# Patient Record
Sex: Male | Born: 2000 | Race: Black or African American | Hispanic: No | Marital: Single | Smoking: Never smoker
Health system: Southern US, Community
[De-identification: ages and names within clinical notes are randomized; demographics above are authoritative.]

## PROBLEM LIST (undated history)

## (undated) ENCOUNTER — Ambulatory Visit (HOSPITAL_COMMUNITY): Admission: EM | Payer: Medicaid Other | Source: Home / Self Care

---
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
## 2021-11-26 IMAGING — DX DG HAND 2V*R*
2 series · 2 of 2 positions shown · non-contrast
Comparison: None.

CLINICAL DATA: Right hand and wrist pain since a fall yesterday.
Initial encounter.

EXAM:
RIGHT HAND - 2 VIEW

[hand pa]
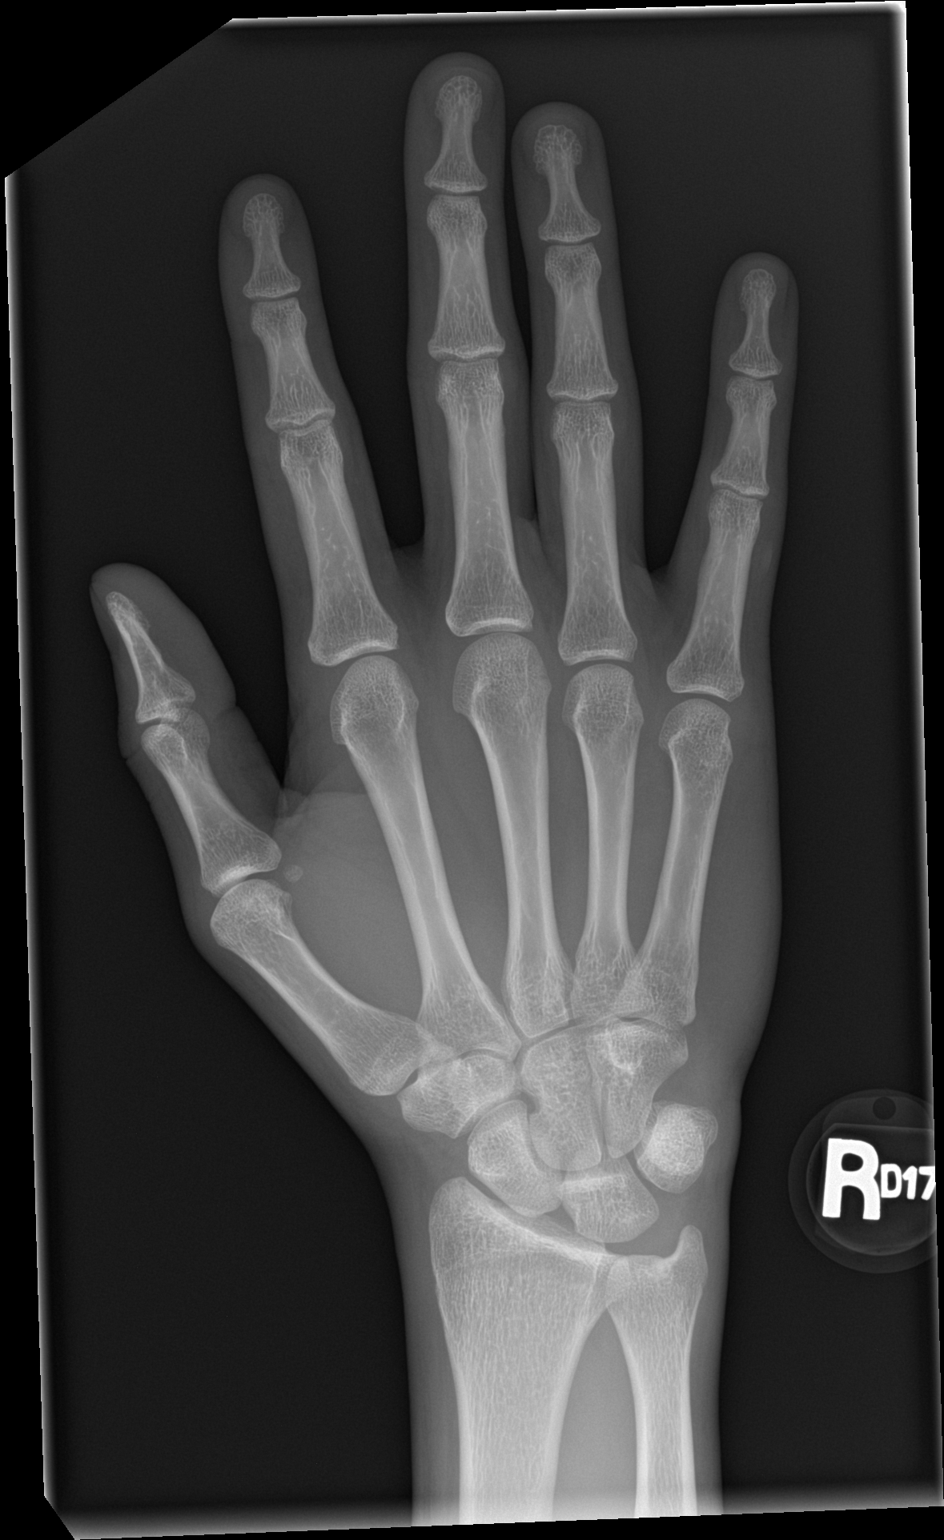

[hand lat]
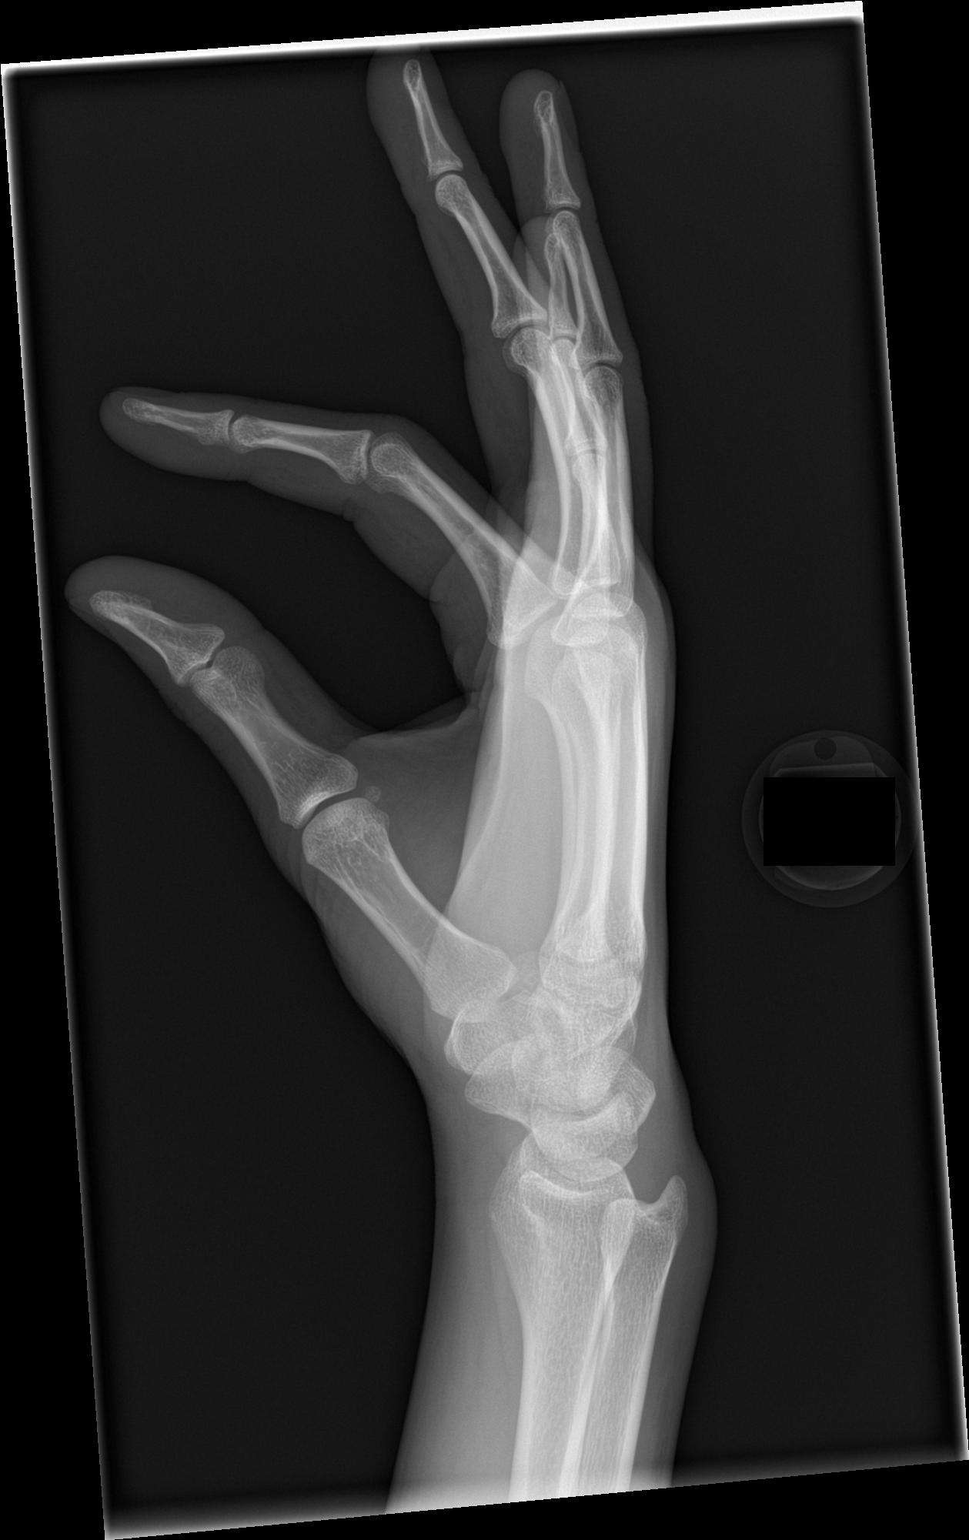

[2 of 2 positions shown; findings below may reference images not displayed]

FINDINGS: There is no evidence of fracture or dislocation. There is no
evidence of arthropathy or other focal bone abnormality. Soft
tissues are unremarkable.
IMPRESSION: Negative exam.

## 2021-11-26 IMAGING — DX DG HUMERUS 2V *R*
2 series · 2 of 2 positions shown · non-contrast
Comparison: None.

CLINICAL DATA: Right upper arm pain since a fall yesterday. Initial
encounter.

EXAM:
RIGHT HUMERUS - 2+ VIEW

[humerus ap]
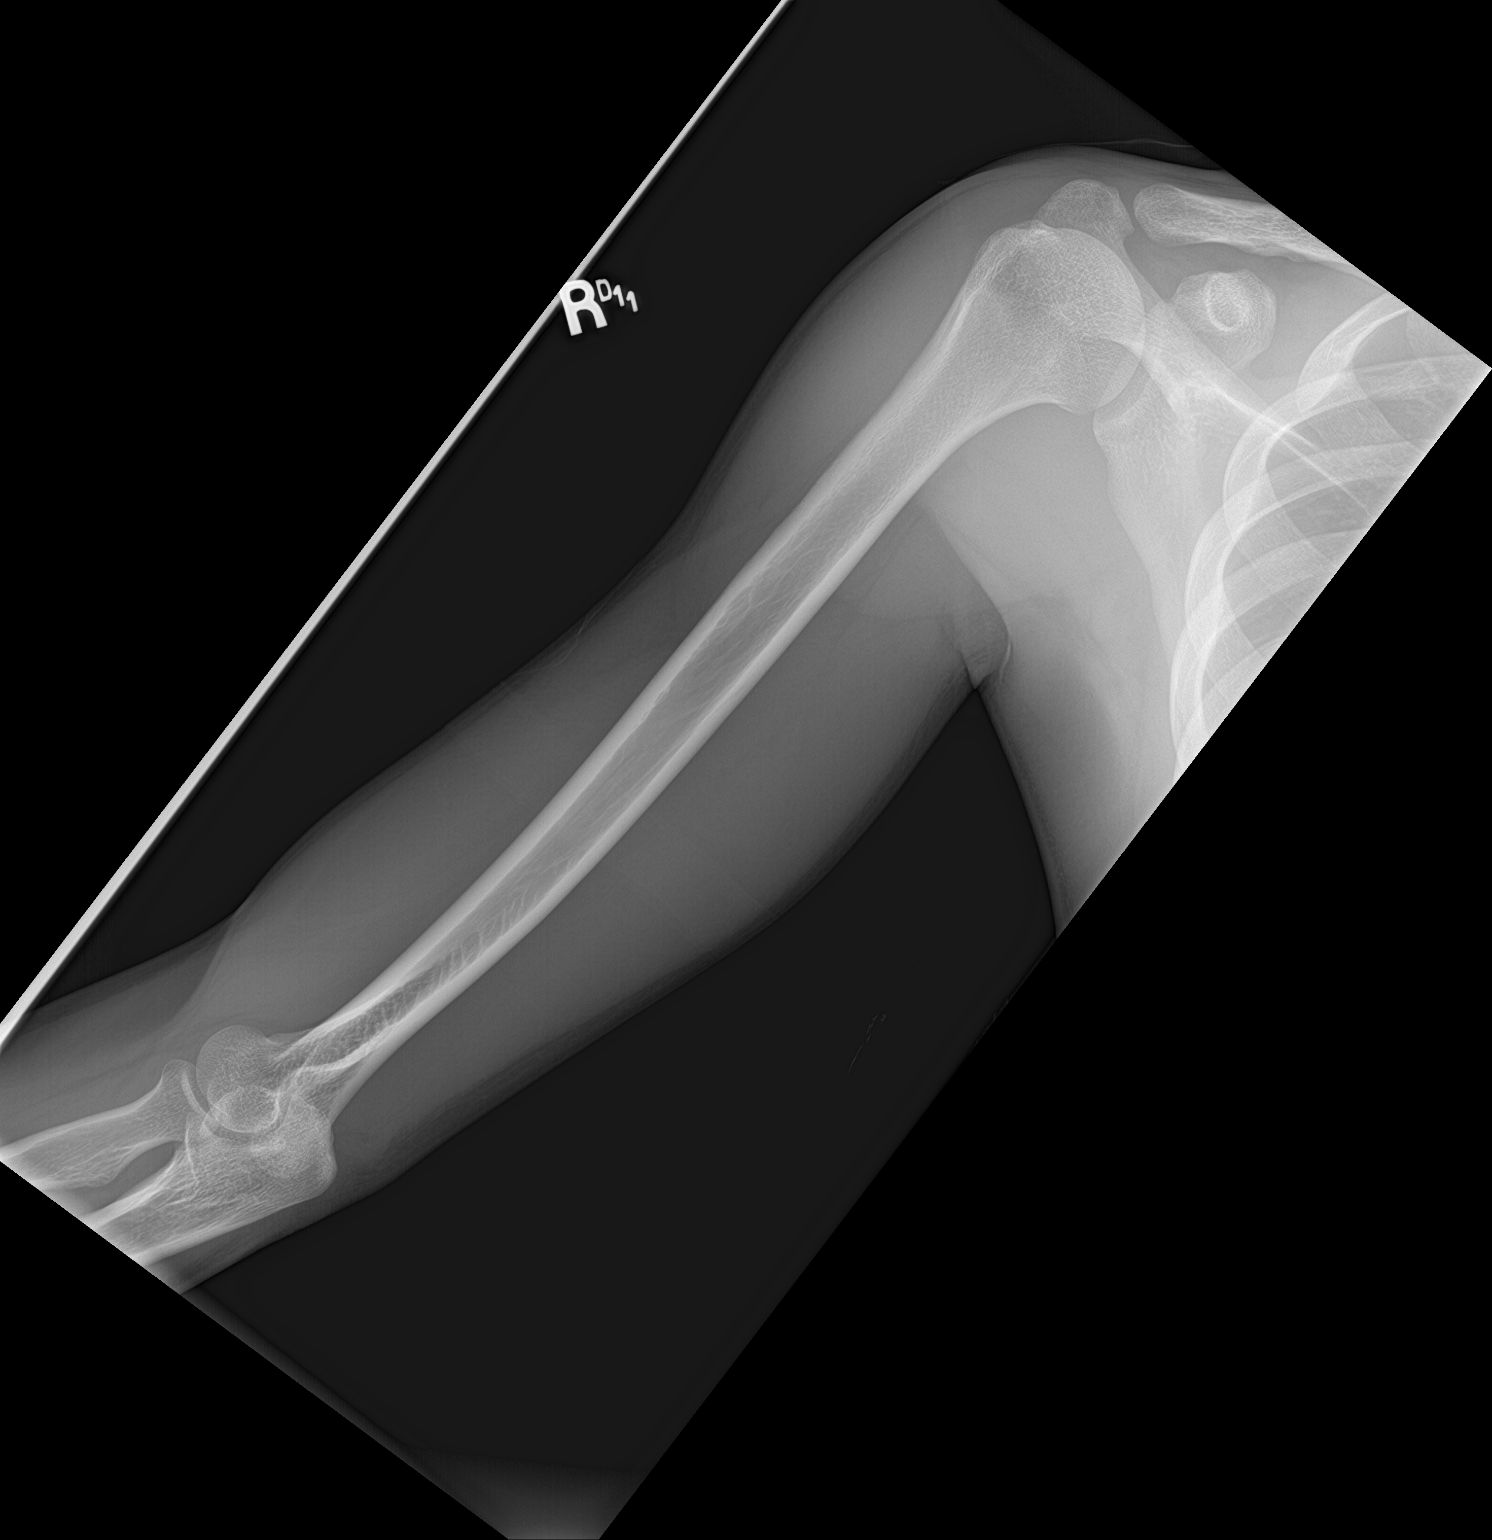

[humerus lat]
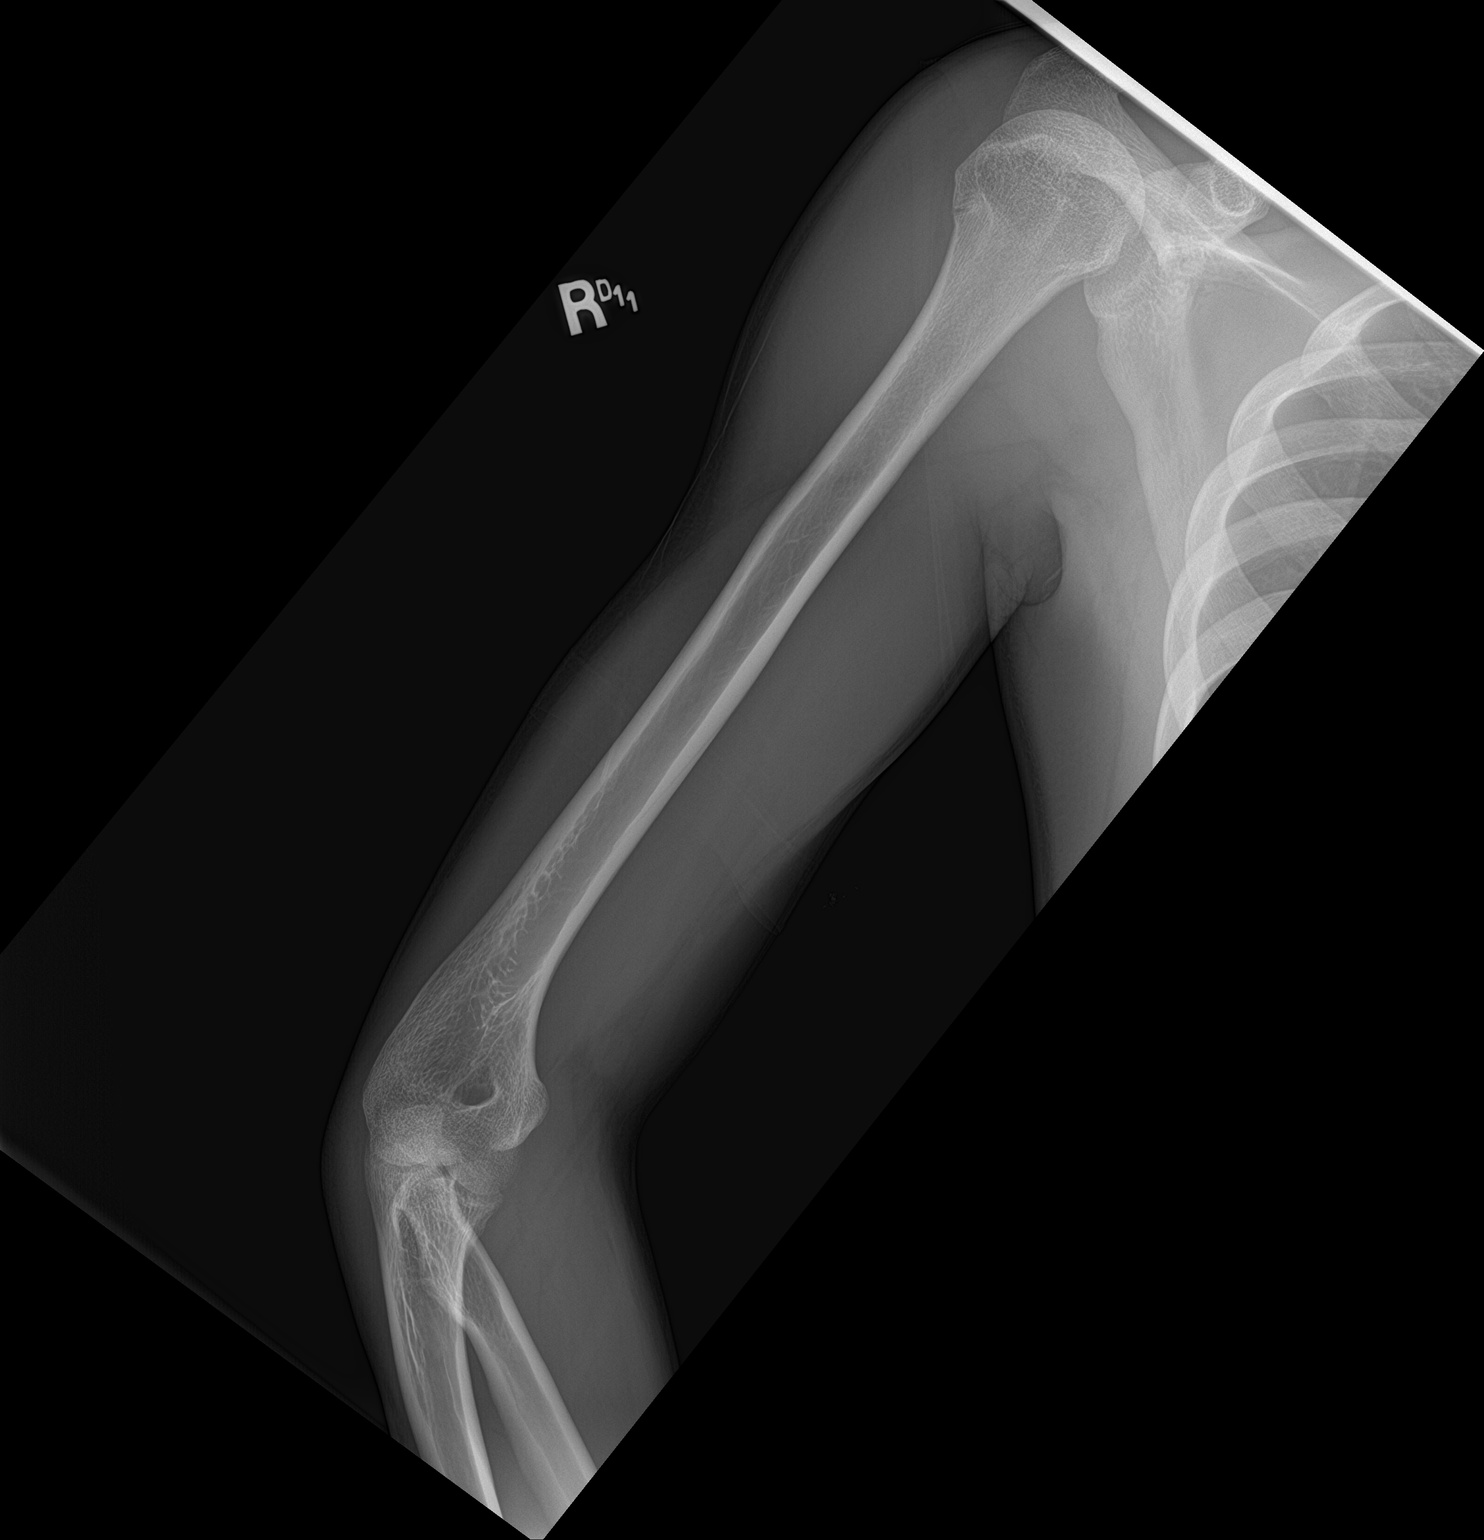

[2 of 2 positions shown; findings below may reference images not displayed]

FINDINGS: There is no evidence of fracture or other focal bone lesions. Soft
tissues are unremarkable.
IMPRESSION: Negative exam.
